# Patient Record
Sex: Female | Born: 1985 | Race: Black or African American | Hispanic: No | Marital: Single | Smoking: Current every day smoker
Health system: Southern US, Community
[De-identification: ages and names within clinical notes are randomized; demographics above are authoritative.]

## PROBLEM LIST (undated history)

## (undated) DIAGNOSIS — K219 Gastro-esophageal reflux disease without esophagitis: Secondary | ICD-10-CM

## (undated) HISTORY — DX: Gastro-esophageal reflux disease without esophagitis: K21.9

---

## 2021-10-29 ENCOUNTER — Emergency Department (HOSPITAL_COMMUNITY): Payer: 59

## 2021-10-29 ENCOUNTER — Emergency Department (HOSPITAL_COMMUNITY)
Admission: EM | Admit: 2021-10-29 | Discharge: 2021-10-29 | Disposition: A | Discharge status: Home / Self Care | Payer: 59 | Attending: Emergency Medicine | Admitting: Emergency Medicine

## 2021-10-29 ENCOUNTER — Encounter (HOSPITAL_COMMUNITY): Payer: Self-pay | Admitting: Emergency Medicine

## 2021-10-29 DIAGNOSIS — L299 Pruritus, unspecified: Secondary | ICD-10-CM | POA: Insufficient documentation

## 2021-10-29 DIAGNOSIS — R109 Unspecified abdominal pain: Secondary | ICD-10-CM | POA: Insufficient documentation

## 2021-10-29 DIAGNOSIS — Z20822 Contact with and (suspected) exposure to covid-19: Secondary | ICD-10-CM | POA: Diagnosis not present

## 2021-10-29 DIAGNOSIS — D509 Iron deficiency anemia, unspecified: Secondary | ICD-10-CM | POA: Insufficient documentation

## 2021-10-29 DIAGNOSIS — R051 Acute cough: Secondary | ICD-10-CM | POA: Insufficient documentation

## 2021-10-29 DIAGNOSIS — R8279 Other abnormal findings on microbiological examination of urine: Secondary | ICD-10-CM | POA: Insufficient documentation

## 2021-10-29 DIAGNOSIS — R21 Rash and other nonspecific skin eruption: Secondary | ICD-10-CM | POA: Diagnosis present

## 2021-10-29 LAB — COMPREHENSIVE METABOLIC PANEL
ALT: 16 U/L (ref 0–44)
AST: 19 U/L (ref 15–41)
Albumin: 4.1 g/dL (ref 3.5–5.0)
Alkaline Phosphatase: 62 U/L (ref 38–126)
Anion gap: 6 (ref 5–15)
BUN: 12 mg/dL (ref 6–20)
CO2: 25 mmol/L (ref 22–32)
Calcium: 8.9 mg/dL (ref 8.9–10.3)
Chloride: 108 mmol/L (ref 98–111)
Creatinine, Ser: 0.65 mg/dL (ref 0.44–1.00)
GFR, Estimated: >60 mL/min (ref >60)
Glucose, Bld: 95 mg/dL (ref 70–99)
Potassium: 3.5 mmol/L (ref 3.5–5.1)
Sodium: 139 mmol/L (ref 135–145)
Total Bilirubin: 0.4 mg/dL (ref 0.3–1.2)
Total Protein: 7.2 g/dL (ref 6.5–8.1)

## 2021-10-29 LAB — URINALYSIS, ROUTINE W REFLEX MICROSCOPIC
Bacteria, UA: NONE SEEN
Bilirubin Urine: NEGATIVE
Glucose, UA: NEGATIVE mg/dL
Hgb urine dipstick: NEGATIVE
Ketones, ur: NEGATIVE mg/dL
Leukocytes,Ua: NEGATIVE
Nitrite: NEGATIVE
Protein, ur: NEGATIVE mg/dL
Specific Gravity, Urine: 1.015 (ref 1.005–1.030)
pH: 7.5 (ref 5.0–8.0)

## 2021-10-29 LAB — CBC WITH DIFFERENTIAL/PLATELET
Abs Immature Granulocytes: 0.01 10*3/uL (ref 0.00–0.07)
Basophils Absolute: 0 10*3/uL (ref 0.0–0.1)
Basophils Relative: 0 %
Eosinophils Absolute: 0.1 10*3/uL (ref 0.0–0.5)
Eosinophils Relative: 2 %
HCT: 29.8 % — ABNORMAL LOW (ref 36.0–46.0)
Hemoglobin: 8.1 g/dL — ABNORMAL LOW (ref 12.0–15.0)
Immature Granulocytes: 0 %
Lymphocytes Relative: 24 %
Lymphs Abs: 1.2 10*3/uL (ref 0.7–4.0)
MCH: 19.5 pg — ABNORMAL LOW (ref 26.0–34.0)
MCHC: 27.2 g/dL — ABNORMAL LOW (ref 30.0–36.0)
MCV: 71.8 fL — ABNORMAL LOW (ref 80.0–100.0)
Monocytes Absolute: 0.3 10*3/uL (ref 0.1–1.0)
Monocytes Relative: 6 %
Neutro Abs: 3.5 10*3/uL (ref 1.7–7.7)
Neutrophils Relative %: 68 %
Platelets: 325 10*3/uL (ref 150–400)
RBC: 4.15 MIL/uL (ref 3.87–5.11)
RDW: 18.2 % — ABNORMAL HIGH (ref 11.5–15.5)
WBC: 5.2 10*3/uL (ref 4.0–10.5)
nRBC: 0 % (ref 0.0–0.2)

## 2021-10-29 LAB — RESP PANEL BY RT-PCR (FLU A&B, COVID) ARPGX2
Influenza A by PCR: NEGATIVE
Influenza B by PCR: NEGATIVE
SARS Coronavirus 2 by RT PCR: NEGATIVE

## 2021-10-29 LAB — LIPASE, BLOOD: Lipase: 41 U/L (ref 11–51)

## 2021-10-29 MED ORDER — BENZONATATE 100 MG PO CAPS: 100.0000 mg | ORAL_CAPSULE | Freq: Three times a day (TID) | ORAL | 0 refills | Status: DC | PRN

## 2021-10-29 MED ORDER — POLYETHYLENE GLYCOL 3350 17 G PO PACK: 17.0000 g | PACK | Freq: Every day | ORAL | 0 refills | Status: AC

## 2021-10-29 MED ORDER — FERROUS SULFATE 325 (65 FE) MG PO TABS: 325.0000 mg | ORAL_TABLET | Freq: Every day | ORAL | 0 refills | Status: DC

## 2021-10-29 MED ORDER — HYDROXYZINE HCL 25 MG PO TABS: 25.0000 mg | ORAL_TABLET | Freq: Three times a day (TID) | ORAL | 0 refills | Status: DC | PRN

## 2021-10-29 MED ORDER — HYDROXYZINE HCL 25 MG PO TABS
25.0000 mg | ORAL_TABLET | Freq: Once | ORAL | Status: AC
  Administered 2021-10-29: 21:00:00 25 mg via ORAL
  Filled 2021-10-29: qty 1

## 2021-10-29 MED ORDER — BENZONATATE 100 MG PO CAPS
100.0000 mg | ORAL_CAPSULE | Freq: Once | ORAL | Status: AC
  Administered 2021-10-29: 21:00:00 100 mg via ORAL
  Filled 2021-10-29: qty 1

## 2021-10-29 NOTE — Discharge Instructions (Addendum)
You came to the emergency department today to be evaluated for your itching, cough, and flank pain.  Your physical exam was reassuring.  Your lab work showed that you had anemia.  Due to this I have started you on iron pills.  Please take them as prescribed.  Please follow-up next week with your primary care provider or urgent care to have your CBC rechecked.  Your itching improved after receiving Atarax medication.  I have given you a prescription of this medication.  Please read attached paperwork for more information on this medication.  Your chest x-ray showed no signs of pneumonia.  Your cough is likely due to a viral upper respiratory infection versus bronchitis.  I have given you a prescription for Tessalon to help with your cough.  Take 1 pill every 8 hours as needed.  You have a COVID and influenza test pending at this time.  You can log onto [facility] MyChart to find your results.  If positive for COVID-19 or influenza please self isolate at home for the next 5 days.  Get help right away if you: Faint. If this happens, do not drive yourself to the hospital. Have chest pain. Have shortness of breath that: Is severe. Gets worse with physical activity. Have an irregular or rapid heartbeat. Become light-headed when getting up from a sitting or lying down position.

## 2021-10-29 NOTE — ED Triage Notes (Signed)
Patient with multiple complaints-states itching all over, B/L flank pain and bronchitis-symptoms for a couple of weeks-progressively getting worse-states she needs an inhaler

## 2021-10-29 NOTE — ED Provider Notes (Signed)
Emergency Medicine Provider Triage Evaluation Note  Marissa Jordan , a 35 y.o. female  was evaluated in triage.  Pt complains of bilateral flank pain and generalized itching.  States that she has been having bilateral flank pain intermittently since January 2022.  Describes her pain as sharp.  Pain has been worse over the last week.  Pain is worse to the left flank.  Patient endorses urinary frequency and urgency.  Patient also reports that over the last week she has had generalized itching all over her body.  Denies any rash.   LMP last month.  Denies any sexual intercourse with men. Review of Systems  Positive: Pruritus, flank pain, urinary urgency and frequency Negative: Fever, chills, dysuria, hematuria, abdominal pain, nausea, vomiting vaginal pain, vaginal bleeding, vaginal discharge  Physical Exam  BP (!) 141/80 (BP Location: Left Arm)    Pulse 96    Temp 98.4 F (36.9 C) (Oral)    Resp 16    SpO2 96%  Gen:   Awake, no distress   Resp:  Normal effort  MSK:   Moves extremities without difficulty  Other:  Abdomen soft, nondistended, nontender.  No guarding or rebound tenderness.  No CVA tenderness bilaterally.  Medical Decision Making  Medically screening exam initiated at 3:37 PM.  Appropriate orders placed.  Marissa Jordan was informed that the remainder of the evaluation will be completed by another provider, this initial triage assessment does not replace that evaluation, and the importance of remaining in the ED until their evaluation is complete.     Loni Beckwith, PA-C 10/29/21 [place], Ankit, MD 10/30/21 1534

## 2021-10-29 NOTE — ED Notes (Signed)
Pt eating [employer].

## 2021-10-29 NOTE — ED Provider Notes (Signed)
[facility] DEPT Provider Note   CSN: [id number] Arrival date & time: 10/29/21  1445     History Chief Complaint  Patient presents with   Flank Pain    Marissa Jordan is a 35 y.o. female presents to the emergency department with a chief complaint of bilateral flank pain, generalized pruritus, and nonproductive cough.  Patient reports that her bilateral flank pain has been intermittent x1 year.  States that pain is worse over the last week.  Patient states that the pain is worse to her left flank.  Patient reports the pain has been constant over the last week.  Denies any aggravating or alleviating factors.  Patient states that she has had urinary frequency over the last week as well.  Denies any dysuria, hematuria, abdominal pain, nausea, vomiting, diarrhea, constipation.  Denies any recent traumatic injuries or heavy lifting.  Patient reports that her general pruritus has been present over the last week.  Patient denies any exposure to new medications, detergents, personal hygiene products.  States that itching is located all over her body.  Patient has tried Benadryl with no relief of symptoms.  Denies any rash, facial swelling, trouble swallowing, trouble breathing.  Patient reports that nonproductive cough has been present over the last 2 days.  Cough has been constant over this time.  Patient has not tried any modalities to alleviate her symptoms.  Denies any associated rhinorrhea, nasal congestion, sore throat, fever, chills.  Patient reports that last menstrual period was at the beginning of last month.  States that there is no way she can be pregnant as she only has sex with female partners.   Flank Pain Pertinent negatives include no chest pain, no abdominal pain, no headaches and no shortness of breath.      History reviewed. No pertinent past medical history.  There are no problems to display for this patient.   History reviewed. No  pertinent surgical history.   OB History   No obstetric history on file.     No family history on file.     Home Medications Prior to Admission medications   Not on File    Allergies    Patient has no allergy information on record.  Review of Systems   Review of Systems  Constitutional:  Negative for chills and fever.  HENT:  Negative for congestion, rhinorrhea and sore throat.   Eyes:  Negative for visual disturbance.  Respiratory:  Positive for cough. Negative for shortness of breath.   Cardiovascular:  Negative for chest pain.  Gastrointestinal:  Negative for abdominal pain, nausea and vomiting.  Genitourinary:  Positive for flank pain. Negative for decreased urine volume, difficulty urinating, dysuria, genital sores, hematuria, pelvic pain, urgency, vaginal bleeding, vaginal discharge and vaginal pain.  Musculoskeletal:  Negative for back pain and neck pain.  Skin:  Negative for color change and rash.  Neurological:  Negative for dizziness, syncope, light-headedness and headaches.  Psychiatric/Behavioral:  Negative for confusion.    Physical Exam Updated Vital Signs BP (!) 145/84 (BP Location: Left Arm)    Pulse 95    Temp 99 F (37.2 C) (Oral)    Resp 16    Ht 5\' 7"  (1.702 m)    Wt 65.8 kg    SpO2 100%    BMI 22.71 kg/m   Physical Exam Vitals and nursing note reviewed.  Constitutional:      General: She is not in acute distress.    Appearance: She is not ill-appearing,  toxic-appearing or diaphoretic.  HENT:     Head: Normocephalic. No right periorbital erythema or left periorbital erythema.     Jaw: No trismus, swelling or pain on movement.     Mouth/Throat:     Lips: Pink. No lesions.     Mouth: Mucous membranes are moist. No oral lesions or angioedema.     Pharynx: Oropharynx is clear. Uvula midline. No pharyngeal swelling, oropharyngeal exudate, posterior oropharyngeal erythema or uvula swelling.  Eyes:     General: No scleral icterus.       Right eye: No  discharge.        Left eye: No discharge.  Cardiovascular:     Rate and Rhythm: Normal rate.  Pulmonary:     Effort: Pulmonary effort is normal. No tachypnea, bradypnea or respiratory distress.     Breath sounds: Normal breath sounds. No stridor.     Comments: Lungs clear to auscultation bilaterally.  Speaks in full complete sentences without difficulty.  Nonproductive cough observed Abdominal:     General: Abdomen is flat. There is no distension. There are no signs of injury.     Palpations: Abdomen is soft. There is no mass or pulsatile mass.     Tenderness: There is no abdominal tenderness. There is no right CVA tenderness, left CVA tenderness, guarding or rebound.     Hernia: There is no hernia in the umbilical area or ventral area.  Skin:    General: Skin is warm and dry.     Findings: No rash. Rash is not crusting, macular, nodular, papular, purpuric, pustular, scaling, urticarial or vesicular.     Comments: No rashes located to patient's bilateral upper extremities, abdomen, upper chest, back.  Neurological:     General: No focal deficit present.     Mental Status: She is alert.  Psychiatric:        Behavior: Behavior is cooperative.    ED Results / Procedures / Treatments   Labs (all labs ordered are listed, but only abnormal results are displayed) Labs Reviewed  CBC WITH DIFFERENTIAL/PLATELET - Abnormal; Notable for the following components:      Result Value   Hemoglobin 8.1 (*)    HCT 29.8 (*)    MCV 71.8 (*)    MCH 19.5 (*)    MCHC 27.2 (*)    RDW 18.2 (*)    All other components within normal limits  RESP PANEL BY RT-PCR (FLU A&B, COVID) ARPGX2  URINE CULTURE  COMPREHENSIVE METABOLIC PANEL  LIPASE, BLOOD  URINALYSIS, ROUTINE W REFLEX MICROSCOPIC    EKG None  Radiology No results found.  Procedures Procedures   Medications Ordered in ED Medications  benzonatate (TESSALON) capsule 100 mg (has no administration in time range)  hydrOXYzine (ATARAX)  tablet 25 mg (has no administration in time range)    ED Course  I have reviewed the triage vital signs and the nursing notes.  Pertinent labs & imaging results that were available during my care of the patient were reviewed by me and considered in my medical decision making (see chart for details).    MDM Rules/Calculators/A&P                           Alert 35 year old female no acute stress, nontoxic-appearing.  Presents emergency department with a complaint of bilateral flank pain, generalized pruritus, nonproductive cough.  Patient states that bilateral flank pain has been intermittent x1 year.  Pain has been constant over  the last week.  Endorses associated urinary frequency.  On exam abdomen soft, nondistended, nontender.  No CVA tenderness.  Urinalysis pending at this time.  Patient reports general pruritus x1 week.  Denies any rash.  No exposure to new soaps, detergents, personal cleaning products, personal hygiene products.  Exam no rash is seen.  We will give patient Atarax for her itching and reassess.  Patient reports nonproductive cough x2 days.  No other upper respiratory infection symptoms.  Lungs clear to auscultation bilaterally.  Will obtain chest x-ray and COVID-19/influenza testing.  Patient given Tessalon to help with her cough.  Patient reports resolution of itching after receiving Atarax.  Patient reports improvement in her cough after receiving Tessalon.  Chest x-ray shows no active cardiopulmonary disease.  Suspect that patient's cough is due to viral URI versus bronchitis.  Urinalysis is unremarkable.  Low suspicion for urinary tract infection at this time.  Plan to discharge patient at this time.  We will give patient prescription for iron due to iron deficiency anemia.  Patient advised to follow-up with primary care provider or urgent care next week for repeat CBC testing.  Will prescribe patient with short course of hydroxyzine for her pruritus.  Prescribe  patient a short course of Tessalon for her cough.  Discussed results, findings, treatment and follow up. Patient advised of return precautions. Patient verbalized understanding and agreed with plan.      Final Clinical Impression(s) / ED Diagnoses Final diagnoses:  Pruritus  Iron deficiency anemia, unspecified iron deficiency anemia type  Acute cough    Rx / DC Orders ED Discharge Orders          Ordered    ferrous sulfate 325 (65 FE) MG tablet  Daily        10/29/21 2115    polyethylene glycol (MIRALAX) 17 g packet  Daily        10/29/21 2115    benzonatate (TESSALON) 100 MG capsule  Every 8 hours PRN        10/29/21 2115    hydrOXYzine (ATARAX) 25 MG tablet  Every 8 hours PRN        10/29/21 2115             Dyann Ruddle 10/29/21 2351    Daleen Bo, MD 11/01/21 [phone]

## 2021-10-31 LAB — URINE CULTURE: Culture: 40000 — AB

## 2021-11-01 ENCOUNTER — Telehealth: Payer: Self-pay | Admitting: Emergency Medicine

## 2021-11-01 NOTE — Telephone Encounter (Signed)
Post ED Visit - Positive Culture Follow-up  Culture report reviewed by antimicrobial stewardship pharmacist: [facility] Team []  Elenor Quinones, Pharm.D. []  Heide Guile, Pharm.D., BCPS AQ-ID []  Parks Neptune, Pharm.D., BCPS []  Alycia Rossetti, Pharm.D., BCPS []  [place], Pharm.D., BCPS, AAHIVP []  Legrand Como, Pharm.D., BCPS, AAHIVP []  Salome Arnt, PharmD, BCPS []  Johnnette Gourd, PharmD, BCPS []  Hughes Better, PharmD, BCPS []  Leeroy Cha, PharmD []  Laqueta Linden, PharmD, BCPS []  Albertina Parr, PharmD  [facility] Team []  Leodis Sias, PharmD []  Lindell Spar, PharmD []  Royetta Asal, PharmD []  Graylin Shiver, Rph []  Rema Fendt) Glennon Mac, PharmD []  Arlyn Dunning, PharmD []  Netta Cedars, PharmD []  Dia Sitter, PharmD []  Leone Haven, PharmD []  Gretta Arab, PharmD []  Theodis Shove, PharmD []  Peggyann Juba, PharmD [x]  Reuel Boom, PharmD   Positive urine culture Treated with none, likely contaminant, no further patient follow-up is required at this time.  Hazle Nordmann 11/01/2021, 11:41 AM

## 2022-01-31 ENCOUNTER — Other Ambulatory Visit: Payer: Self-pay | Admitting: *Deleted

## 2022-01-31 DIAGNOSIS — R1900 Intra-abdominal and pelvic swelling, mass and lump, unspecified site: Secondary | ICD-10-CM

## 2022-02-05 ENCOUNTER — Emergency Department (HOSPITAL_COMMUNITY): Payer: 59

## 2022-02-05 ENCOUNTER — Emergency Department (HOSPITAL_COMMUNITY)
Admission: EM | Admit: 2022-02-05 | Discharge: 2022-02-05 | Disposition: A | Discharge status: Home / Self Care | Payer: 59 | Attending: Emergency Medicine | Admitting: Emergency Medicine

## 2022-02-05 ENCOUNTER — Other Ambulatory Visit: Payer: Self-pay

## 2022-02-05 DIAGNOSIS — R42 Dizziness and giddiness: Secondary | ICD-10-CM | POA: Insufficient documentation

## 2022-02-05 DIAGNOSIS — D219 Benign neoplasm of connective and other soft tissue, unspecified: Secondary | ICD-10-CM

## 2022-02-05 DIAGNOSIS — D649 Anemia, unspecified: Secondary | ICD-10-CM | POA: Insufficient documentation

## 2022-02-05 DIAGNOSIS — N9489 Other specified conditions associated with female genital organs and menstrual cycle: Secondary | ICD-10-CM | POA: Insufficient documentation

## 2022-02-05 DIAGNOSIS — D259 Leiomyoma of uterus, unspecified: Secondary | ICD-10-CM | POA: Insufficient documentation

## 2022-02-05 DIAGNOSIS — N83202 Unspecified ovarian cyst, left side: Secondary | ICD-10-CM | POA: Insufficient documentation

## 2022-02-05 DIAGNOSIS — R1032 Left lower quadrant pain: Secondary | ICD-10-CM | POA: Diagnosis present

## 2022-02-05 LAB — CBC WITH DIFFERENTIAL/PLATELET
Abs Immature Granulocytes: 0.01 10*3/uL (ref 0.00–0.07)
Basophils Absolute: 0 10*3/uL (ref 0.0–0.1)
Basophils Relative: 0 %
Eosinophils Absolute: 0.1 10*3/uL (ref 0.0–0.5)
Eosinophils Relative: 2 %
HCT: 34.9 % — ABNORMAL LOW (ref 36.0–46.0)
Hemoglobin: 10.4 g/dL — ABNORMAL LOW (ref 12.0–15.0)
Immature Granulocytes: 0 %
Lymphocytes Relative: 27 %
Lymphs Abs: 1.2 10*3/uL (ref 0.7–4.0)
MCH: 23.9 pg — ABNORMAL LOW (ref 26.0–34.0)
MCHC: 29.8 g/dL — ABNORMAL LOW (ref 30.0–36.0)
MCV: 80 fL (ref 80.0–100.0)
Monocytes Absolute: 0.2 10*3/uL (ref 0.1–1.0)
Monocytes Relative: 5 %
Neutro Abs: 2.9 10*3/uL (ref 1.7–7.7)
Neutrophils Relative %: 66 %
Platelets: 305 10*3/uL (ref 150–400)
RBC: 4.36 MIL/uL (ref 3.87–5.11)
RDW: 18.4 % — ABNORMAL HIGH (ref 11.5–15.5)
WBC: 4.4 10*3/uL (ref 4.0–10.5)
nRBC: 0 % (ref 0.0–0.2)

## 2022-02-05 LAB — COMPREHENSIVE METABOLIC PANEL
ALT: 17 U/L (ref 0–44)
AST: 20 U/L (ref 15–41)
Albumin: 4.2 g/dL (ref 3.5–5.0)
Alkaline Phosphatase: 63 U/L (ref 38–126)
Anion gap: 8 (ref 5–15)
BUN: 8 mg/dL (ref 6–20)
CO2: 21 mmol/L — ABNORMAL LOW (ref 22–32)
Calcium: 8.9 mg/dL (ref 8.9–10.3)
Chloride: 108 mmol/L (ref 98–111)
Creatinine, Ser: 0.68 mg/dL (ref 0.44–1.00)
GFR, Estimated: >60 mL/min (ref >60)
Glucose, Bld: 97 mg/dL (ref 70–99)
Potassium: 3.7 mmol/L (ref 3.5–5.1)
Sodium: 137 mmol/L (ref 135–145)
Total Bilirubin: 0.4 mg/dL (ref 0.3–1.2)
Total Protein: 7.3 g/dL (ref 6.5–8.1)

## 2022-02-05 LAB — URINALYSIS, ROUTINE W REFLEX MICROSCOPIC
Bilirubin Urine: NEGATIVE
Glucose, UA: NEGATIVE mg/dL
Hgb urine dipstick: NEGATIVE
Ketones, ur: NEGATIVE mg/dL
Nitrite: NEGATIVE
Protein, ur: NEGATIVE mg/dL
Specific Gravity, Urine: 1.008 (ref 1.005–1.030)
pH: 5 (ref 5.0–8.0)

## 2022-02-05 LAB — I-STAT BETA HCG BLOOD, ED (MC, WL, AP ONLY): I-stat hCG, quantitative: <5 m[IU]/mL (ref <5)

## 2022-02-05 MED ORDER — MECLIZINE HCL 12.5 MG PO TABS: 12.5000 mg | ORAL_TABLET | Freq: Three times a day (TID) | ORAL | 0 refills | Status: DC | PRN

## 2022-02-05 MED ORDER — HYDROCODONE-ACETAMINOPHEN 5-325 MG PO TABS: 1.0000 | ORAL_TABLET | ORAL | 0 refills | Status: DC | PRN

## 2022-02-05 NOTE — ED Triage Notes (Signed)
Patient reports L inguinal pain, had went to the UC yesterday and they told her to come to the ED for an [place]. Denies abnormal discharge, reports vaginal dryness. Reports dizziness.  ?

## 2022-02-05 NOTE — ED Notes (Signed)
Ultrasound at bedside

## 2022-02-05 NOTE — Discharge Instructions (Signed)
Return to the ED with any new symptoms like we discussed ?Please follow-up with the PCP I referred you to. ?Please also follow-up with the women's health center I referred her to.  The number is attached to this discharge packet ?Please pick up your medicine from the pharmacy that you requested ?Please read the attached informational guide concerning ovarian cyst and uterine fibroids ?

## 2022-02-05 NOTE — ED Provider Notes (Signed)
?[facility] DEPT ?Provider Note ? ? ?CSN: [id number] ?Arrival date & time: 02/05/22  9983 ? ?  ? ?History ? ?Chief Complaint  ?Patient presents with  ? Groin Pain  ? ? ?Marissa Jordan is a 36 y.o. female with no medical history provided.  Patient reports to ED for evaluation of left-sided groin pain.  Patient states that for the "last couple of months" she had left-sided groin pain that is acutely worsened in the last few days.  Patient states that she has not been seen for this issue by medical provider, she recently moved here from [place].  Patient states that she is having painful intercourse, climax.  Patient went to walk-in clinic recently was told to come to ER for evaluation, ultrasound.  Patient also complaining of dizziness, history of anemia, patient states that she feels dizzy and has been unable to get her balance.  Patient states that she has been seen for the same complaint before and diagnosed with "dizziness, not vertigo.  She said it was just a symptom".  Patient provided with meclizine at past provider.  Patient endorsing dizziness, left-sided groin pain, vaginal dryness, painful intercourse.  Patient denies any fevers, nausea, vomiting, diarrhea, vaginal discharge, vaginal pain, vaginal itchiness. ? ? ?Groin Pain ? ? ?  ? ?Home Medications ?Prior to Admission medications   ?Medication Sig Start Date End Date Taking? Authorizing Provider  ?HYDROcodone-acetaminophen (NORCO/VICODIN) 5-325 MG tablet Take 1 tablet by mouth every 4 (four) hours as needed for moderate pain. 02/05/22  Yes Azucena Cecil, PA-C  ?meclizine (ANTIVERT) 12.5 MG tablet Take 1 tablet (12.5 mg total) by mouth 3 (three) times daily as needed for dizziness. 02/05/22  Yes Azucena Cecil, PA-C  ?benzonatate (TESSALON) 100 MG capsule Take 1 capsule (100 mg total) by mouth every 8 (eight) hours as needed for cough. 10/29/21   Loni Beckwith, PA-C  ?ferrous sulfate 325 (65 FE) MG tablet Take 1  tablet (325 mg total) by mouth daily. 10/29/21   Loni Beckwith, PA-C  ?hydrOXYzine (ATARAX) 25 MG tablet Take 1 tablet (25 mg total) by mouth every 8 (eight) hours as needed for itching. 10/29/21   Loni Beckwith, PA-C  ?polyethylene glycol (MIRALAX) 17 g packet Take 17 g by mouth daily. 10/29/21   Loni Beckwith, PA-C  ?   ? ?Allergies    ?Patient has no known allergies.   ? ?Review of Systems   ?Review of Systems  ?Constitutional:  Negative for chills and fever.  ?Gastrointestinal:  Negative for diarrhea, nausea and vomiting.  ?Genitourinary:  Positive for dyspareunia. Negative for dysuria, pelvic pain, vaginal discharge and vaginal pain.  ?Neurological:  Positive for dizziness.  ?All other systems reviewed and are negative. ? ?Physical Exam ?Updated Vital Signs ?BP 126/84 (BP Location: Right Arm)   Pulse 87   Temp 98.4 ?F (36.9 ?C) (Oral)   Resp 18   LMP 01/05/2022 (Approximate)   SpO2 95%  ?Physical Exam ?Vitals and nursing note reviewed.  ?Constitutional:   ?   General: She is not in acute distress. ?   Appearance: Normal appearance. She is not ill-appearing, toxic-appearing or diaphoretic.  ?HENT:  ?   Head: Normocephalic and atraumatic.  ?   Nose: Nose normal. No congestion.  ?   Mouth/Throat:  ?   Mouth: Mucous membranes are moist.  ?   Pharynx: Oropharynx is clear.  ?Eyes:  ?   Extraocular Movements: Extraocular movements intact.  ?   Conjunctiva/sclera:  Conjunctivae normal.  ?   Pupils: Pupils are equal, round, and reactive to light.  ?Cardiovascular:  ?   Rate and Rhythm: Normal rate and regular rhythm.  ?Pulmonary:  ?   Effort: Pulmonary effort is normal.  ?   Breath sounds: Normal breath sounds. No wheezing.  ?Abdominal:  ?   General: Abdomen is flat. Bowel sounds are normal.  ?   Palpations: Abdomen is soft.  ?   Tenderness: There is abdominal tenderness in the left lower quadrant.  ?Musculoskeletal:  ?   Cervical back: Normal range of motion and neck supple. No rigidity.   ?Skin: ?   General: Skin is warm and dry.  ?   Capillary Refill: Capillary refill takes less than 2 seconds.  ?Neurological:  ?   Mental Status: She is alert and oriented to person, place, and time.  ? ? ?ED Results / Procedures / Treatments   ?Labs ?(all labs ordered are listed, but only abnormal results are displayed) ?Labs Reviewed  ?CBC WITH DIFFERENTIAL/PLATELET - Abnormal; Notable for the following components:  ?    Result Value  ? Hemoglobin 10.4 (*)   ? HCT 34.9 (*)   ? MCH 23.9 (*)   ? MCHC 29.8 (*)   ? RDW 18.4 (*)   ? All other components within normal limits  ?COMPREHENSIVE METABOLIC PANEL - Abnormal; Notable for the following components:  ? CO2 21 (*)   ? All other components within normal limits  ?URINALYSIS, ROUTINE W REFLEX MICROSCOPIC - Abnormal; Notable for the following components:  ? APPearance HAZY (*)   ? Leukocytes,Ua TRACE (*)   ? Bacteria, UA RARE (*)   ? All other components within normal limits  ?I-STAT BETA HCG BLOOD, ED ([facility], [facility], [facility] ONLY)  ? ? ?EKG ?None ? ?Radiology ?US Transvaginal Non-OB ? ?Result Date: 02/05/2022 ?CLINICAL DATA:  Left-sided pelvic pain. EXAM: TRANSABDOMINAL AND TRANSVAGINAL ULTRASOUND OF PELVIS DOPPLER ULTRASOUND OF OVARIES TECHNIQUE: Both transabdominal and transvaginal ultrasound examinations of the pelvis were performed. Transabdominal technique was performed for global imaging of the pelvis including uterus, ovaries, adnexal regions, and pelvic cul-de-sac. It was necessary to proceed with endovaginal exam following the transabdominal exam to visualize the ovaries. Color and duplex Doppler ultrasound was utilized to evaluate blood flow to the ovaries. COMPARISON:  None. FINDINGS: Uterus Measurements: 8.8 x 5.7 x 6.1 cm = volume: 161 mL. Two separate ill-defined fundal hypoechoic masses likely represent fibroids and measure approximately 2.5 cm and 2.2 cm in respective maximal diameter. Endometrium Thickness: 11 mm.  No focal abnormality visualized. Right ovary  Measurements: 3.0 x 1.5 x 2.4 cm = volume: 5.4 mL. Normal appearance/no adnexal mass. Left ovary Measurements: 3.1 x 2.3 x 2.6 cm = volume: 9.3 mL. Complex cystic area measures approximately 2.0 x 1.7 x 1.7 cm by transvaginal exam and likely represents a hemorrhagic cyst. Pulsed Doppler evaluation of both ovaries demonstrates normal low-resistance arterial and venous waveforms. Other findings No abnormal free fluid. IMPRESSION: 1. Small uterine fibroids. 2. 2 cm complex cyst of the left ovary likely represents a hemorrhagic cyst. No associated free fluid. No followup imaging recommended. Note: This recommendation does not apply to premenarchal patients or to those with increased risk (genetic, family history, elevated tumor markers or other high-risk factors) of ovarian cancer. Reference: Radiology 2019 Nov; 293(2):359-371. Electronically Signed   By: Aletta Edouard M.D.   On: 02/05/2022 12:07  ? ?US Pelvis Complete ? ?Result Date: 02/05/2022 ?CLINICAL DATA:  Left-sided pelvic pain. EXAM:  TRANSABDOMINAL AND TRANSVAGINAL ULTRASOUND OF PELVIS DOPPLER ULTRASOUND OF OVARIES TECHNIQUE: Both transabdominal and transvaginal ultrasound examinations of the pelvis were performed. Transabdominal technique was performed for global imaging of the pelvis including uterus, ovaries, adnexal regions, and pelvic cul-de-sac. It was necessary to proceed with endovaginal exam following the transabdominal exam to visualize the ovaries. Color and duplex Doppler ultrasound was utilized to evaluate blood flow to the ovaries. COMPARISON:  None. FINDINGS: Uterus Measurements: 8.8 x 5.7 x 6.1 cm = volume: 161 mL. Two separate ill-defined fundal hypoechoic masses likely represent fibroids and measure approximately 2.5 cm and 2.2 cm in respective maximal diameter. Endometrium Thickness: 11 mm.  No focal abnormality visualized. Right ovary Measurements: 3.0 x 1.5 x 2.4 cm = volume: 5.4 mL. Normal appearance/no adnexal mass. Left ovary  Measurements: 3.1 x 2.3 x 2.6 cm = volume: 9.3 mL. Complex cystic area measures approximately 2.0 x 1.7 x 1.7 cm by transvaginal exam and likely represents a hemorrhagic cyst. Pulsed Doppler evaluation of both ovaries demonstrates

## 2022-02-05 NOTE — ED Provider Triage Note (Signed)
Emergency Medicine Provider Triage Evaluation Note ? ?Marissa Jordan , a 36 y.o. female  was evaluated in triage.  Pt complains of left groin pain, worse with intercourse/climax, radiates to rectum, lasts 5 minutes. Went to a walk in clinic and was told to come to the ER.  ?Diagnosed with adenomyosis, reports medication caused her to bleed. Hx anemia, hgb 9, states feels dizzy, unsure if due to anemia.  ?Symptoms started about a month ago, pain was worse today in the groin and prompted her to come to the ER.  ?Review of Systems  ?Positive: Groin pain ?Negative: Fevers, abnormal dc ? ?Physical Exam  ?BP 126/84 (BP Location: Right Arm)   Pulse 87   Temp 98.4 ?F (36.9 ?C) (Oral)   Resp 18   LMP 01/05/2022 (Approximate)   SpO2 95%  ?Gen:   Awake, no distress   ?Resp:  Normal effort  ?MSK:   Moves extremities without difficulty  ?Other:  TTP just left of midline suprapubic area, no appreciable mass or skin changes  ? ?Medical Decision Making  ?Medically screening exam initiated at 9:51 AM.  Appropriate orders placed.  Marissa Jordan was informed that the remainder of the evaluation will be completed by another provider, this initial triage assessment does not replace that evaluation, and the importance of remaining in the ED until their evaluation is complete. ? ? ?  ?Tacy Learn, PA-C ?02/05/22 0957 ? ?

## 2022-03-10 ENCOUNTER — Ambulatory Visit: Payer: 59 | Admitting: Family Medicine

## 2022-07-04 ENCOUNTER — Telehealth: Payer: Self-pay | Admitting: Oncology

## 2022-07-04 NOTE — Telephone Encounter (Signed)
Scheduled appt per 8/28 referral. Pt is aware of appt date and time. Pt is aware to arrive 15 mins prior to appt time and to bring and updated insurance card. Pt is aware of appt location.   

## 2022-07-26 ENCOUNTER — Inpatient Hospital Stay: Payer: 59 | Attending: Oncology | Admitting: Oncology

## 2022-07-26 ENCOUNTER — Telehealth: Payer: Self-pay | Admitting: *Deleted

## 2022-07-26 NOTE — Telephone Encounter (Signed)
PC to patient regarding her missed appointment this a.m., she forgot about it.  Informed patient our scheduling department will contact her to reschedule, she verbalizes understanding.  Scheduling message sent.

## 2022-07-27 ENCOUNTER — Telehealth: Payer: Self-pay | Admitting: Oncology

## 2022-07-27 NOTE — Telephone Encounter (Signed)
Called pt to r/s missed appt. No answer. Left msg for pt to call back to r/s appt.

## 2023-01-13 ENCOUNTER — Telehealth: Payer: Self-pay | Admitting: Internal Medicine

## 2023-01-13 NOTE — Telephone Encounter (Signed)
scheduled per 3/6 referral , pt has been called and confirmed date and time. Pt is aware of location and to arrive early for check in   

## 2023-01-23 ENCOUNTER — Other Ambulatory Visit: Payer: Self-pay

## 2023-01-23 DIAGNOSIS — D649 Anemia, unspecified: Secondary | ICD-10-CM

## 2023-01-24 ENCOUNTER — Inpatient Hospital Stay: Payer: Medicaid Other

## 2023-01-24 ENCOUNTER — Inpatient Hospital Stay: Payer: Medicaid Other | Attending: Internal Medicine | Admitting: Internal Medicine

## 2023-02-20 NOTE — Progress Notes (Unsigned)
[facility] [phone]   [phone]  INITIAL CONSULT NOTE  Patient Care Team: Pcp, No as PCP - General  Hematological/Oncological History Labs from [facility] 06/30/2022: WBC 5.5, Hgb 7.7 (L), MCV 67.3 (L), Plt 324, Iron 13 (L), TIBC 508 (H), Iron saturation 3% (L), Ferritin 2.7 (L).  01/12/2023: WBC 4.7, Hgb 8.5 (L), MCV 70.4 (L), Plt 319 02/21/2023: Establish care with [facility]  CHIEF COMPLAINTS/PURPOSE OF CONSULTATION:  Iron deficiency anemia 2/2 menorrhagia   HISTORY OF PRESENTING ILLNESS:  Marissa Jordan 37 y.o. female with medical history significant for GERD presents to the hematology clinic for evaluation for iron deficiency anemia.   On exam today, Marissa Jordan reports that she has been iron deficient for several months.  She takes iron pills every few days due to baseline constipation that has worsened.  She has heavy menstrual cycles lasting 7 days with 3 days of heavy bleeding.  She requires using an ultra tampon and menstrual underwear and changing it every hour on the days of heavy bleeding.  She is planning to try Lysteda to help with her menstrual bleeding.  She is not interested in hormone contraceptive therapy.  Marissa Jordan reports she is fatigued which is chronic in nature but getting worse over several months.  She reports her fatigue interferes with her work and she required frequent resting.  She reports occasional episodes of dizziness without any syncopal episodes.  She does have nausea with vomiting but contributes this to her constipation.  She has a bowel movement once a week with the use of laxatives.  She adds that she is planning to undergo EGD and colonoscopy for further evaluation.  Patient does crave ice.  Patient reports increased urinary frequency without dysuria or hematuria.  Patient denies fevers, chills, night sweats, shortness of breath, chest pain or cough.  She has no other complaints.  MEDICAL HISTORY:   Past Medical History:  Diagnosis Date   GERD (gastroesophageal reflux disease)     SURGICAL HISTORY: History reviewed. No pertinent surgical history.  SOCIAL HISTORY: Social History   Socioeconomic History   Marital status: Single    Spouse name: Not on file   Number of children: Not on file   Years of education: Not on file   Highest education level: Not on file  Occupational History   Not on file  Tobacco Use   Smoking status: Every Day    Years: 10    Types: Cigarettes   Smokeless tobacco: Never   Tobacco comments:    Smoke 2-3 cigarettes a day  Substance and Sexual Activity   Alcohol use: Yes    Alcohol/week: 2.0 standard drinks of alcohol    Types: 2 Shots of liquor per week    Comment: socially   Drug use: Never   Sexual activity: Not on file  Other Topics Concern   Not on file  Social History Narrative   Not on file   Social Determinants of Health   Financial Resource Strain: Not on file  Food Insecurity: Food Insecurity Present (02/21/2023)   Hunger Vital Sign    Worried About Running Out of Food in the Last Year: Never true    Ran Out of Food in the Last Year: Sometimes true  Transportation Needs: No Transportation Needs (02/21/2023)   PRAPARE - Administrator, Civil Service (Medical): No    Lack of Transportation (Non-Medical): No  Physical Activity: Not on file  Stress: Not on file  Social Connections: Not on file  Intimate Partner Violence: Not At Risk (02/21/2023)   Humiliation, Afraid, Rape, and Kick questionnaire    Fear of Current or Ex-Partner: No    Emotionally Abused: No    Physically Abused: No    Sexually Abused: No    FAMILY HISTORY: Family History  Problem Relation Age of Onset   Cancer Maternal Grandmother    Cancer Maternal Grandfather     ALLERGIES:  has No Known Allergies.  MEDICATIONS:  Current Outpatient Medications  Medication Sig Dispense Refill   pantoprazole (PROTONIX) 40 MG tablet Take by mouth.      polyethylene glycol (MIRALAX) 17 g packet Take 17 g by mouth daily. 14 each 0   benzonatate (TESSALON) 100 MG capsule Take 1 capsule (100 mg total) by mouth every 8 (eight) hours as needed for cough. (Patient not taking: Reported on 02/21/2023) 21 capsule 0   ferrous sulfate 325 (65 FE) MG tablet Take 1 tablet (325 mg total) by mouth daily. (Patient not taking: Reported on 02/21/2023) 30 tablet 0   HYDROcodone-acetaminophen (NORCO/VICODIN) 5-325 MG tablet Take 1 tablet by mouth every 4 (four) hours as needed for moderate pain. (Patient not taking: Reported on 02/21/2023) 5 tablet 0   hydrOXYzine (ATARAX) 25 MG tablet Take 1 tablet (25 mg total) by mouth every 8 (eight) hours as needed for itching. (Patient not taking: Reported on 02/21/2023) 12 tablet 0   meclizine (ANTIVERT) 12.5 MG tablet Take 1 tablet (12.5 mg total) by mouth 3 (three) times daily as needed for dizziness. (Patient not taking: Reported on 02/21/2023) 30 tablet 0   No current facility-administered medications for this visit.    REVIEW OF SYSTEMS:   Constitutional: ( - ) fevers, ( - )  chills , ( - ) night sweats Eyes: ( - ) blurriness of vision, ( - ) double vision, ( - ) watery eyes Ears, nose, mouth, throat, and face: ( - ) mucositis, ( - ) sore throat Respiratory: ( - ) cough, ( - ) dyspnea, ( - ) wheezes Cardiovascular: ( - ) palpitation, ( - ) chest discomfort, ( - ) lower extremity swelling Gastrointestinal:  ( + ) nausea, ( - ) heartburn, (+ ) change in bowel habits Skin: ( - ) abnormal skin rashes Lymphatics: ( - ) new lymphadenopathy, ( - ) easy bruising Neurological: ( - ) numbness, ( - ) tingling, ( - ) new weaknesses Behavioral/Psych: ( - ) mood change, ( - ) new changes  All other systems were reviewed with the patient and are negative.  PHYSICAL EXAMINATION: ECOG PERFORMANCE STATUS: 1 - Symptomatic but completely ambulatory  Vitals:   02/21/23 0912  BP: (!) 141/88  Pulse: 92  Resp: 17  Temp: (!) 97.3 F (36.3  C)  SpO2: 100%   Filed Weights   02/21/23 0912  Weight: 143 lb 1.6 oz (64.9 kg)    GENERAL: well appearing female in NAD  SKIN: skin color, texture, turgor are normal, no rashes or significant lesions EYES: conjunctiva are pink and non-injected, sclera clear OROPHARYNX: no exudate, no erythema; lips, buccal mucosa, and tongue normal  LUNGS: clear to auscultation and percussion with normal breathing effort HEART: regular rate & rhythm and no murmurs and no lower extremity edema Musculoskeletal: no cyanosis of digits and no clubbing  PSYCH: alert & oriented x 3, fluent speech NEURO: no focal motor/sensory deficits  LABORATORY DATA:  I have reviewed the data as listed    Latest Ref Rng &  Units 02/21/2023   10:16 AM 02/05/2022   10:49 AM 10/29/2021    4:11 PM  CBC  WBC 4.0 - 10.5 K/uL 4.0  4.4  5.2   Hemoglobin 12.0 - 15.0 g/dL 8.8  21.3  8.1   Hematocrit 36.0 - 46.0 % 29.8  34.9  29.8   Platelets 150 - 400 K/uL 311  305  325        Latest Ref Rng & Units 02/05/2022   10:49 AM 10/29/2021    4:11 PM  CMP  Glucose 70 - 99 mg/dL 97  95   BUN 6 - 20 mg/dL 8  12   Creatinine 0.86 - 1.00 mg/dL 5.78  4.69   Sodium 629 - 145 mmol/L 137  139   Potassium 3.5 - 5.1 mmol/L 3.7  3.5   Chloride 98 - 111 mmol/L 108  108   CO2 22 - 32 mmol/L 21  25   Calcium 8.9 - 10.3 mg/dL 8.9  8.9   Total Protein 6.5 - 8.1 g/dL 7.3  7.2   Total Bilirubin 0.3 - 1.2 mg/dL 0.4  0.4   Alkaline Phos 38 - 126 U/L 63  62   AST 15 - 41 U/L 20  19   ALT 0 - 44 U/L 17  16    ASSESSMENT & PLAN Denzil Bristol is a 37 y.o. female who presents to the hematology clinic for evaluation of iron deficiency anemia.   # Iron Deficiency Anemia 2/2 to GYN Bleeding --Findings are consistent with iron deficiency anemia secondary to patient's menorrhagia --Encouraged her to follow-up with OB/GYN for better control of her menstrual cycles --We will confirm iron deficiency anemia by ordering iron panel and ferritin as well  as reticulocytes, CBC, and CMP --Unable to tolerate PO iron due to constipation --We will plan to proceed with IV iron therapy in order to help bolster the patient's blood counts --Plan for return to clinic in 4 to 6 weeks time after last dose of IV iron  #Constipation: --Encouraged patient to be more aggressive with stool regimen including senakot plus miralax.     Orders Placed This Encounter  Procedures   CBC with Differential (Cancer Center Only)    Standing Status:   Future    Number of Occurrences:   1    Standing Expiration Date:   02/20/2024   CMP (Cancer Center only)    Standing Status:   Future    Number of Occurrences:   1    Standing Expiration Date:   02/20/2024   Ferritin    Standing Status:   Future    Number of Occurrences:   1    Standing Expiration Date:   02/20/2024   Sample to Blood Bank    Standing Status:   Future    Number of Occurrences:   1    Standing Expiration Date:   02/20/2024    All questions were answered. The patient knows to call the clinic with any problems, questions or concerns.  I have spent a total of 60 minutes minutes of face-to-face and non-face-to-face time, preparing to see the patient, obtaining and/or reviewing separately obtained history, performing a medically appropriate examination, counseling and educating the patient, ordering medications/tests/procedures, documenting clinical information in the electronic health record,and care coordination.   Georga Kaufmann, PA-C Department of Hematology/Oncology [facility] Phone: [phone]  Patient was seen with Dr. Leonides Schanz.   I have read the above note and personally  examined the patient. I agree with the assessment and plan as noted above.  Briefly Marissa Jordan is a 37 year old female who presents for evaluation of iron deficiency anemia.  This iron deficiency anemia is thought to be secondary to heavy menstrual cycles.  On her last check on  02/05/2022 patient had a hemoglobin of 10.4 with blood cell count 4.4 and platelets of 305.  The patient is unable to tolerate p.o. iron therapy.  Today we will order a full iron panel and if found to be deficient we will replete her with IV iron therapy.  Additionally recommend she continue to follow-up with OB/GYN for better control of her menstrual cycles.   Ulysees Barns, MD Department of Hematology/Oncology [facility] at [facility] Phone: [phone] Pager: [phone] Email: [email or web address]

## 2023-02-21 ENCOUNTER — Encounter: Payer: Self-pay | Admitting: Physician Assistant

## 2023-02-21 ENCOUNTER — Inpatient Hospital Stay: Payer: Medicaid Other

## 2023-02-21 ENCOUNTER — Telehealth: Payer: Self-pay | Admitting: Physician Assistant

## 2023-02-21 ENCOUNTER — Inpatient Hospital Stay: Payer: Medicaid Other | Attending: Internal Medicine | Admitting: Physician Assistant

## 2023-02-21 ENCOUNTER — Other Ambulatory Visit: Payer: Self-pay

## 2023-02-21 VITALS — BP 141/88 | HR 92 | Temp 97.3°F | Resp 17 | Wt 143.1 lb

## 2023-02-21 DIAGNOSIS — Z79899 Other long term (current) drug therapy: Secondary | ICD-10-CM

## 2023-02-21 DIAGNOSIS — K219 Gastro-esophageal reflux disease without esophagitis: Secondary | ICD-10-CM | POA: Diagnosis not present

## 2023-02-21 DIAGNOSIS — Z809 Family history of malignant neoplasm, unspecified: Secondary | ICD-10-CM | POA: Diagnosis not present

## 2023-02-21 DIAGNOSIS — K5900 Constipation, unspecified: Secondary | ICD-10-CM | POA: Diagnosis not present

## 2023-02-21 DIAGNOSIS — D500 Iron deficiency anemia secondary to blood loss (chronic): Secondary | ICD-10-CM | POA: Diagnosis not present

## 2023-02-21 DIAGNOSIS — R110 Nausea: Secondary | ICD-10-CM

## 2023-02-21 DIAGNOSIS — R112 Nausea with vomiting, unspecified: Secondary | ICD-10-CM | POA: Diagnosis not present

## 2023-02-21 DIAGNOSIS — R42 Dizziness and giddiness: Secondary | ICD-10-CM

## 2023-02-21 DIAGNOSIS — R350 Frequency of micturition: Secondary | ICD-10-CM | POA: Diagnosis not present

## 2023-02-21 DIAGNOSIS — N920 Excessive and frequent menstruation with regular cycle: Secondary | ICD-10-CM

## 2023-02-21 DIAGNOSIS — F17210 Nicotine dependence, cigarettes, uncomplicated: Secondary | ICD-10-CM

## 2023-02-21 DIAGNOSIS — Z5941 Food insecurity: Secondary | ICD-10-CM

## 2023-02-21 DIAGNOSIS — R5383 Other fatigue: Secondary | ICD-10-CM | POA: Diagnosis not present

## 2023-02-21 LAB — SAMPLE TO BLOOD BANK

## 2023-02-21 LAB — CBC WITH DIFFERENTIAL (CANCER CENTER ONLY)
Abs Immature Granulocytes: 0.02 10*3/uL (ref 0.00–0.07)
Basophils Absolute: 0 10*3/uL (ref 0.0–0.1)
Basophils Relative: 0 %
Eosinophils Absolute: 0.1 10*3/uL (ref 0.0–0.5)
Eosinophils Relative: 3 %
HCT: 29.8 % — ABNORMAL LOW (ref 36.0–46.0)
Hemoglobin: 8.8 g/dL — ABNORMAL LOW (ref 12.0–15.0)
Immature Granulocytes: 1 %
Lymphocytes Relative: 26 %
Lymphs Abs: 1 10*3/uL (ref 0.7–4.0)
MCH: 21.3 pg — ABNORMAL LOW (ref 26.0–34.0)
MCHC: 29.5 g/dL — ABNORMAL LOW (ref 30.0–36.0)
MCV: 72 fL — ABNORMAL LOW (ref 80.0–100.0)
Monocytes Absolute: 0.3 10*3/uL (ref 0.1–1.0)
Monocytes Relative: 7 %
Neutro Abs: 2.6 10*3/uL (ref 1.7–7.7)
Neutrophils Relative %: 63 %
Platelet Count: 311 10*3/uL (ref 150–400)
RBC: 4.14 MIL/uL (ref 3.87–5.11)
RDW: 16.1 % — ABNORMAL HIGH (ref 11.5–15.5)
WBC Count: 4 10*3/uL (ref 4.0–10.5)
nRBC: 0 % (ref 0.0–0.2)

## 2023-02-21 LAB — IRON AND IRON BINDING CAPACITY (CC-WL,HP ONLY)
Iron: 19 ug/dL — ABNORMAL LOW (ref 28–170)
Saturation Ratios: 4 % — ABNORMAL LOW (ref 10.4–31.8)
TIBC: 517 ug/dL — ABNORMAL HIGH (ref 250–450)
UIBC: 498 ug/dL — ABNORMAL HIGH (ref 148–442)

## 2023-02-21 LAB — CMP (CANCER CENTER ONLY)
ALT: 14 U/L (ref 0–44)
AST: 17 U/L (ref 15–41)
Albumin: 4.1 g/dL (ref 3.5–5.0)
Alkaline Phosphatase: 68 U/L (ref 38–126)
Anion gap: 5 (ref 5–15)
BUN: 9 mg/dL (ref 6–20)
CO2: 26 mmol/L (ref 22–32)
Calcium: 9.3 mg/dL (ref 8.9–10.3)
Chloride: 109 mmol/L (ref 98–111)
Creatinine: 0.74 mg/dL (ref 0.44–1.00)
GFR, Estimated: >60 mL/min (ref >60)
Glucose, Bld: 86 mg/dL (ref 70–99)
Potassium: 3.8 mmol/L (ref 3.5–5.1)
Sodium: 140 mmol/L (ref 135–145)
Total Bilirubin: 0.4 mg/dL (ref 0.3–1.2)
Total Protein: 6.9 g/dL (ref 6.5–8.1)

## 2023-02-21 LAB — FERRITIN: Ferritin: 4 ng/mL — ABNORMAL LOW (ref 11–307)

## 2023-02-21 NOTE — Telephone Encounter (Signed)
Reached out to patient to schedule per 4/16 LOS, patient aware of date and time of appointments.

## 2023-02-22 ENCOUNTER — Telehealth: Payer: Self-pay

## 2023-02-22 NOTE — Telephone Encounter (Signed)
Pt advised and agreed to the plan for IV iron.  She is aware after insurance approval [facility] will call to arrange appt

## 2023-02-22 NOTE — Telephone Encounter (Signed)
-----   Message from Briant Cedar, PA-C sent at 02/21/2023  2:22 PM EDT ----- Please confirm with patient that labs show iron deficiency anemia. We will arrange for IV iron as discussed.

## 2023-02-23 ENCOUNTER — Encounter: Payer: Self-pay | Admitting: Physician Assistant

## 2023-02-23 ENCOUNTER — Other Ambulatory Visit (HOSPITAL_COMMUNITY): Payer: Self-pay

## 2023-02-28 ENCOUNTER — Telehealth: Payer: Self-pay | Admitting: Pharmacy Technician

## 2023-02-28 NOTE — Telephone Encounter (Signed)
Rondon Che note:  Patient will be scheduled as soon as possible. Auth Submission: NO AUTH NEEDED Site of care: Site of care: [facility] Payer: [place] medicaid healthy blue Medication & CPT/J Code(s) submitted: Monoferric (Ferrci derisomaltose) [phone] Route of submission (phone, fax, portal):  Phone # Fax # Auth type: Buy/Bill Units/visits requested: 1 Reference number: [id number] 02/28/23 9:14a Approval from: 02/28/23 to 06/30/23

## 2023-03-06 ENCOUNTER — Ambulatory Visit (INDEPENDENT_AMBULATORY_CARE_PROVIDER_SITE_OTHER): Payer: Medicaid Other | Admitting: *Deleted

## 2023-03-06 VITALS — BP 133/78 | HR 62 | Temp 98.5°F | Resp 18 | Ht 67.0 in | Wt 143.6 lb

## 2023-03-06 DIAGNOSIS — N920 Excessive and frequent menstruation with regular cycle: Secondary | ICD-10-CM | POA: Diagnosis not present

## 2023-03-06 DIAGNOSIS — D500 Iron deficiency anemia secondary to blood loss (chronic): Secondary | ICD-10-CM | POA: Diagnosis not present

## 2023-03-06 MED ORDER — SODIUM CHLORIDE 0.9 % IV SOLN
1000.0000 mg | Freq: Once | INTRAVENOUS | Status: AC
  Administered 2023-03-06: 1000 mg via INTRAVENOUS
  Filled 2023-03-06: qty 10

## 2023-03-06 NOTE — Patient Instructions (Signed)

## 2023-03-06 NOTE — Progress Notes (Signed)
Diagnosis: Iron Deficiency Anemia  Provider:  Chilton Greathouse MD  Procedure: IV Infusion  IV Type: Peripheral, IV Location: R Hand  Monoferric (Ferric Derisomaltose), Dose: 1000 mg  Infusion Start Time: 0927 am  Infusion Stop Time: 0959  am  Post Infusion IV Care: Observation period completed and Peripheral IV Discontinued  Discharge: Condition: Good, Destination: Home . AVS Provided  Performed by:  Forrest Moron, RN

## 2023-04-11 ENCOUNTER — Other Ambulatory Visit: Payer: Self-pay | Admitting: Physician Assistant

## 2023-04-11 DIAGNOSIS — D500 Iron deficiency anemia secondary to blood loss (chronic): Secondary | ICD-10-CM

## 2023-04-12 ENCOUNTER — Inpatient Hospital Stay: Payer: Medicaid Other | Admitting: Physician Assistant

## 2023-04-12 ENCOUNTER — Inpatient Hospital Stay: Payer: Medicaid Other | Attending: Internal Medicine

## 2023-04-23 IMAGING — US US ART/VEN ABD/PELV/SCROTUM DOPPLER LTD
1 series · 13 of 25 positions shown · non-contrast
Comparison: None.

CLINICAL DATA: Left-sided pelvic pain.

EXAM:
TRANSABDOMINAL AND TRANSVAGINAL ULTRASOUND OF PELVIS
DOPPLER ULTRASOUND OF OVARIES
TECHNIQUE: Both transabdominal and transvaginal ultrasound examinations of the
pelvis were performed. Transabdominal technique was performed for
global imaging of the pelvis including uterus, ovaries, adnexal
regions, and pelvic cul-de-sac.
It was necessary to proceed with endovaginal exam following the
transabdominal exam to visualize the ovaries. Color and duplex
Doppler ultrasound was utilized to evaluate blood flow to the
ovaries.

[Series 1: us art/ven abd/pelv/scrotum doppler ltd · 13 of 137 slices shown]
[im 1/137]
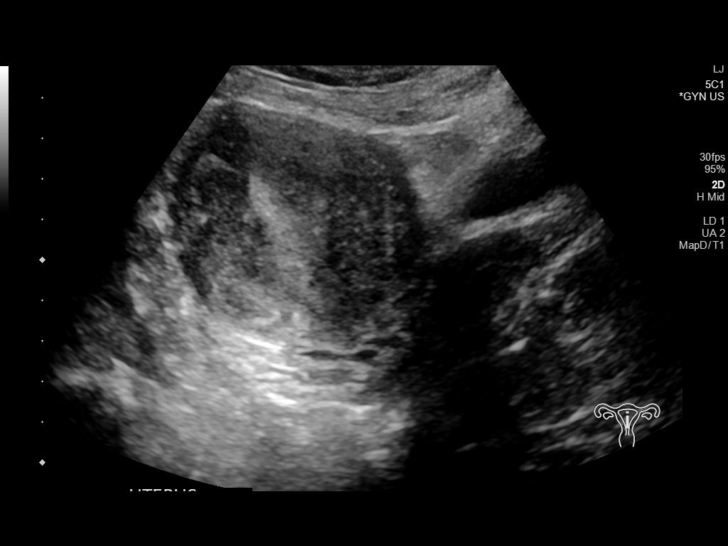
[im 12/137]
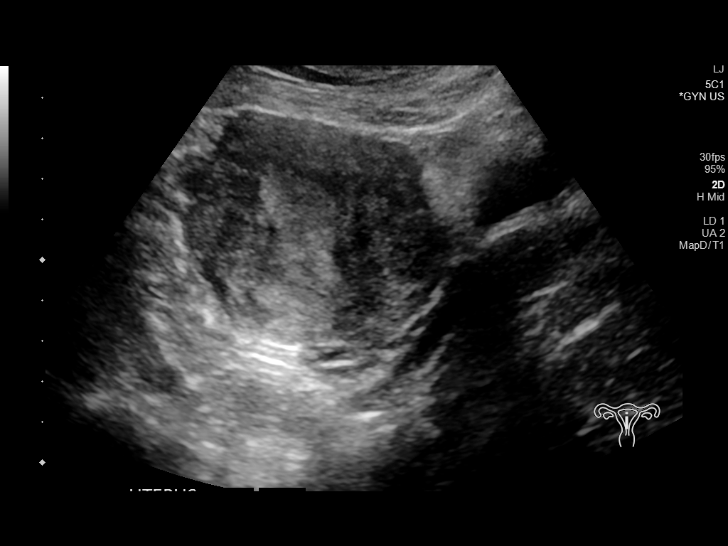
[im 23/137]
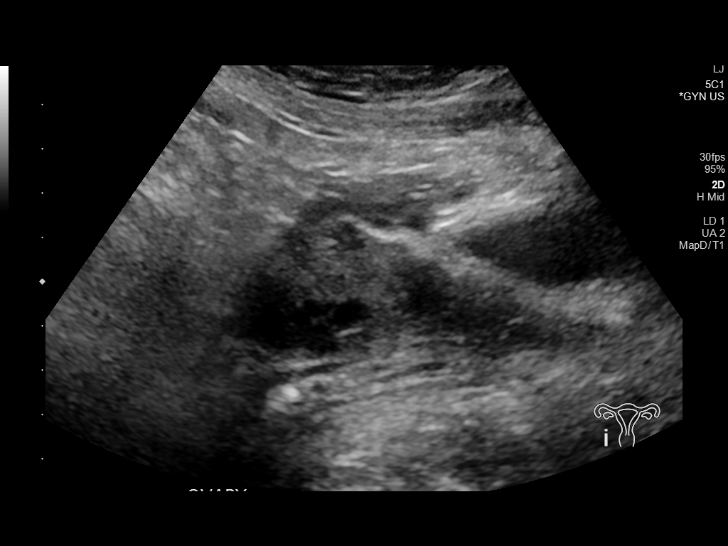
[im 35/137]
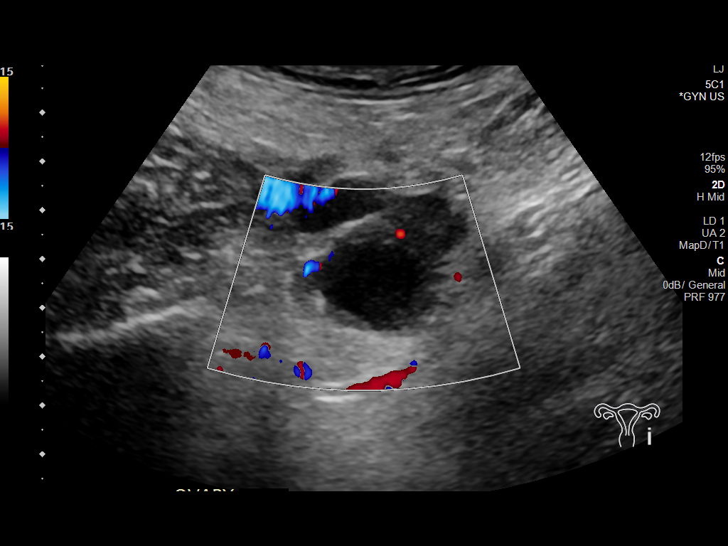
[im 46/137]
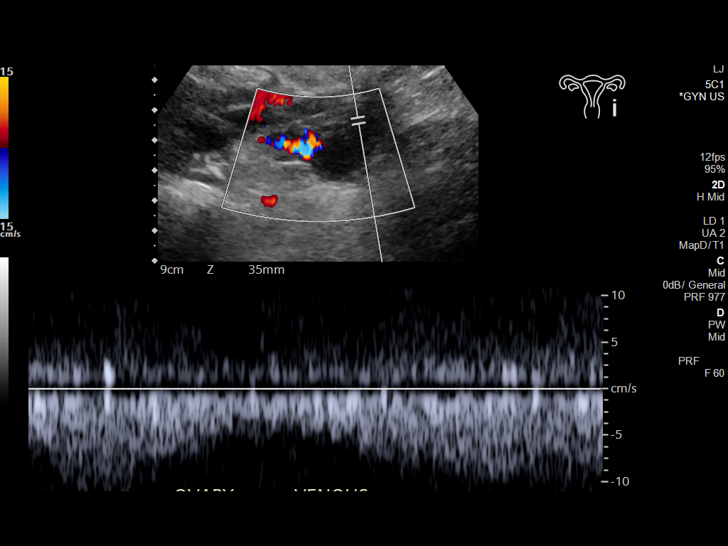
[im 57/137]
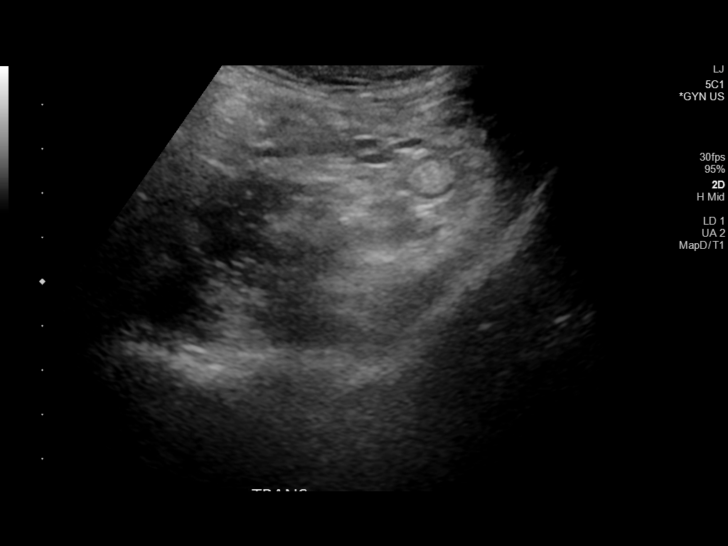
[im 69/137]
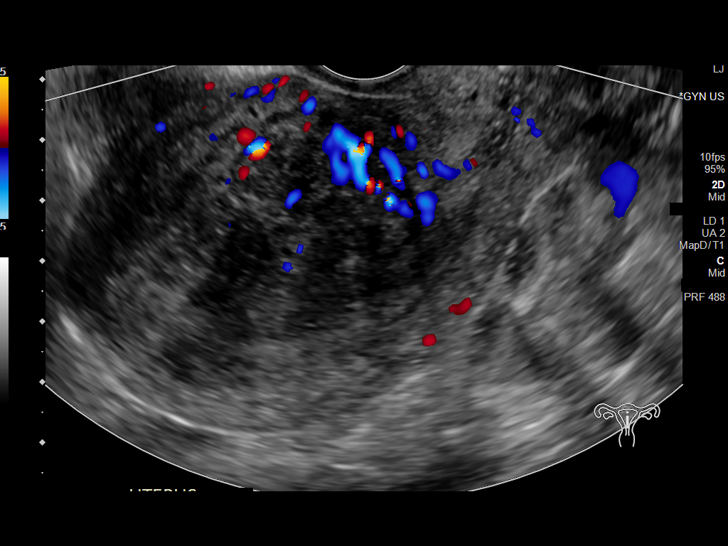
[im 80/137]
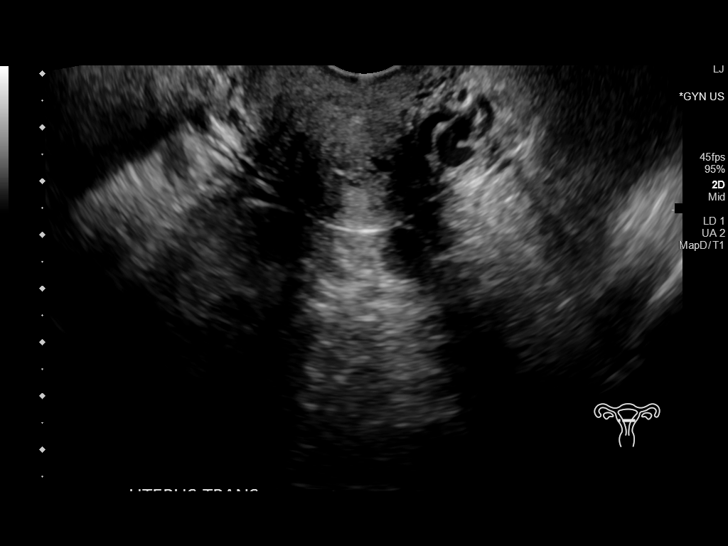
[im 91/137]
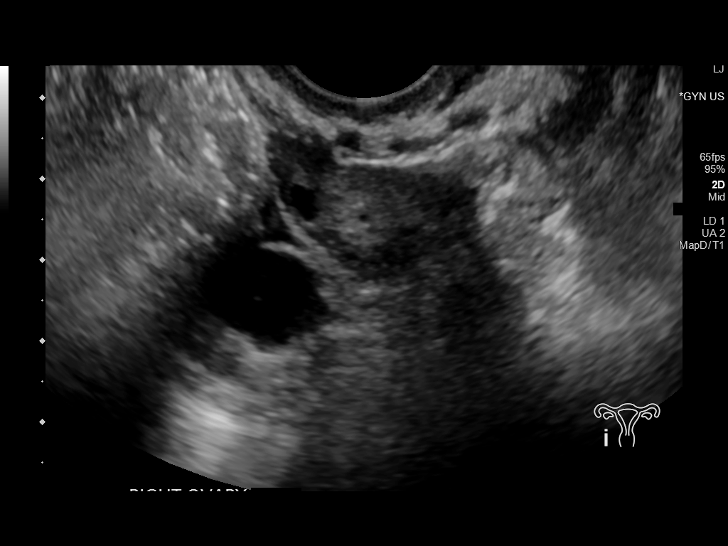
[im 103/137]
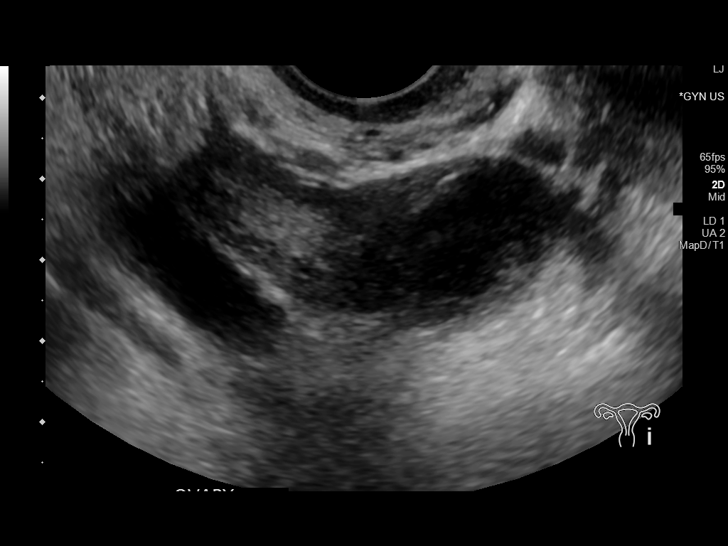
[im 114/137]
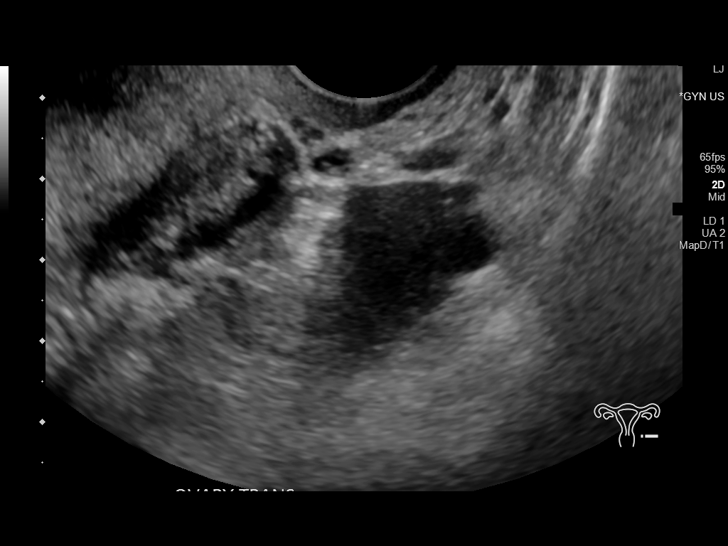
[im 125/137]
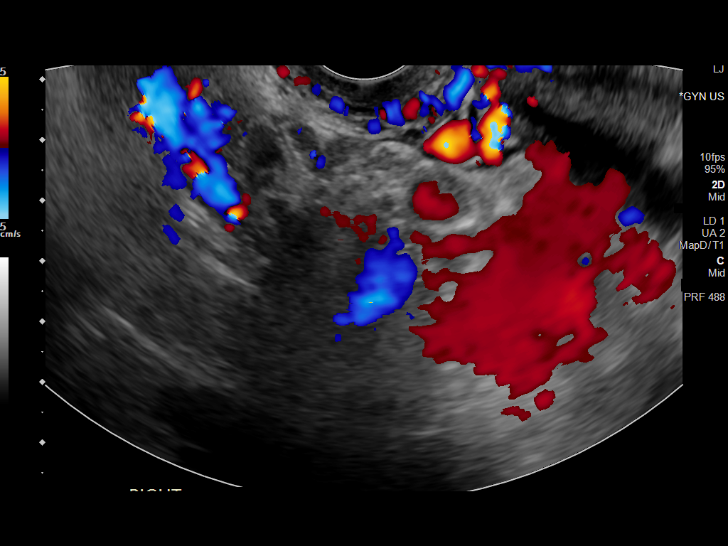
[im 137/137]
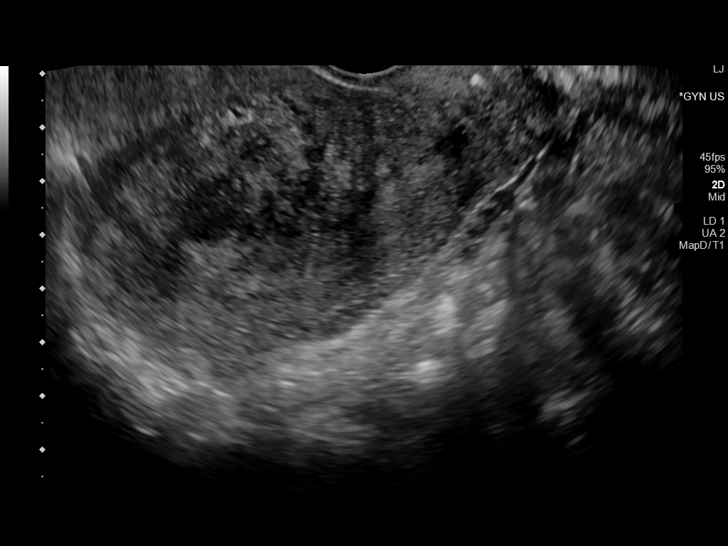

[13 of 25 positions shown; findings below may reference images not displayed]

FINDINGS: Uterus

Measurements: 8.8 x 5.7 x 6.1 cm = volume: 161 mL. Two separate
ill-defined fundal hypoechoic masses likely represent fibroids and
measure approximately 2.5 cm and 2.2 cm in respective maximal
diameter.

Endometrium

Thickness: 11 mm.  No focal abnormality visualized.

Right ovary

Measurements: 3.0 x 1.5 x 2.4 cm = volume: 5.4 mL. Normal
appearance/no adnexal mass.

Left ovary

Measurements: 3.1 x 2.3 x 2.6 cm = volume: 9.3 mL. Complex cystic
area measures approximately 2.0 x 1.7 x 1.7 cm by transvaginal exam
and likely represents a hemorrhagic cyst.

Pulsed Doppler evaluation of both ovaries demonstrates normal
low-resistance arterial and venous waveforms.

Other findings

No abnormal free fluid.
IMPRESSION: 1. Small uterine fibroids.
2. 2 cm complex cyst of the left ovary likely represents a
hemorrhagic cyst. No associated free fluid. No followup imaging
recommended. Note: This recommendation does not apply to
premenarchal patients or to those with increased risk (genetic,
family history, elevated tumor markers or other high-risk factors)
of ovarian cancer. Reference: Radiology [DATE]):[id number].

## 2023-06-29 ENCOUNTER — Encounter (HOSPITAL_BASED_OUTPATIENT_CLINIC_OR_DEPARTMENT_OTHER): Payer: Self-pay | Admitting: Emergency Medicine

## 2023-06-29 ENCOUNTER — Other Ambulatory Visit: Payer: Self-pay

## 2023-06-29 ENCOUNTER — Emergency Department (HOSPITAL_BASED_OUTPATIENT_CLINIC_OR_DEPARTMENT_OTHER)
Admission: EM | Admit: 2023-06-29 | Discharge: 2023-06-29 | Disposition: A | Discharge status: Home / Self Care | Payer: Medicaid Other | Attending: Emergency Medicine | Admitting: Emergency Medicine

## 2023-06-29 DIAGNOSIS — H04123 Dry eye syndrome of bilateral lacrimal glands: Secondary | ICD-10-CM | POA: Insufficient documentation

## 2023-06-29 DIAGNOSIS — H04129 Dry eye syndrome of unspecified lacrimal gland: Secondary | ICD-10-CM

## 2023-06-29 NOTE — ED Triage Notes (Addendum)
Pain in both eyes X 1 week, has tried multiple OTC and home remedies with no relief. Denies injury or exposure.

## 2023-06-29 NOTE — ED Provider Notes (Signed)
   [facility]  Provider Note  CSN: [id number] Arrival date & time: 06/29/23 0259  History Chief Complaint  Patient presents with   Eye Pain    Marissa Jordan is a 37 y.o. female with no significant PMH reports about a week of dry eyes, both sides about the same. Some relief with OTC lubricating drops but returns when they wear off. No drainage, no blurry vision. She is not able to see an eye doctor until next week.    Home Medications Prior to Admission medications   Medication Sig Start Date End Date Taking? Authorizing Provider  pantoprazole (PROTONIX) 40 MG tablet Take by mouth. 04/16/20   [provider]  polyethylene glycol (MIRALAX) 17 g packet Take 17 g by mouth daily. 10/29/21   Haskel Schroeder, PA-C     Allergies    Patient has no known allergies.   Review of Systems   Review of Systems Please see HPI for pertinent positives and negatives  Physical Exam BP 133/75 (BP Location: Left Arm)   Pulse 86   Temp 99.5 F (37.5 C) (Oral)   Resp 18   Ht 5\' 7"  (1.702 m)   Wt 65.8 kg   LMP 06/12/2023 (Approximate)   SpO2 98%   BMI 22.71 kg/m   Physical Exam Vitals and nursing note reviewed.  HENT:     Head: Normocephalic.     Nose: Nose normal.  Eyes:     Extraocular Movements: Extraocular movements intact.     Conjunctiva/sclera: Conjunctivae normal.     Pupils: Pupils are equal, round, and reactive to light.     Comments: Vision is grossly intact. Anterior chambers are clear.   Pulmonary:     Effort: Pulmonary effort is normal.  Musculoskeletal:        General: Normal range of motion.     Cervical back: Neck supple.  Skin:    Findings: No rash (on exposed skin).  Neurological:     Mental Status: She is alert and oriented to person, place, and time.  Psychiatric:        Mood and Affect: Mood normal.     ED Results / Procedures / Treatments   EKG None  Procedures Procedures  Medications  Ordered in the ED Medications - No data to display  Initial Impression and Plan  Patient here with dry eyes, ongoing for about a week. No signs of infection and vision is not affected. Advised she will need outpatient ophtho follow up for formal diagnosis and management. Continue OTC meds in the meantime.  ED Course       MDM Rules/Calculators/A&P Medical Decision Making Problems Addressed: Dry eye: acute illness or injury  Risk OTC drugs.     Final Clinical Impression(s) / ED Diagnoses Final diagnoses:  Dry eye    Rx / DC Orders ED Discharge Orders     None        Pollyann Savoy, MD 06/29/23 [phone]

## 2024-10-21 ENCOUNTER — Other Ambulatory Visit: Payer: Self-pay | Admitting: Nurse Practitioner

## 2024-10-21 DIAGNOSIS — N920 Excessive and frequent menstruation with regular cycle: Secondary | ICD-10-CM

## 2024-10-21 DIAGNOSIS — D259 Leiomyoma of uterus, unspecified: Secondary | ICD-10-CM

## 2024-10-28 ENCOUNTER — Other Ambulatory Visit

## 2024-10-29 ENCOUNTER — Ambulatory Visit
Admission: RE | Admit: 2024-10-29 | Discharge: 2024-10-29 | Disposition: A | Discharge status: Home / Self Care | Source: Ambulatory Visit | Attending: Nurse Practitioner | Admitting: Nurse Practitioner

## 2024-10-29 DIAGNOSIS — D259 Leiomyoma of uterus, unspecified: Secondary | ICD-10-CM

## 2024-10-29 DIAGNOSIS — N920 Excessive and frequent menstruation with regular cycle: Secondary | ICD-10-CM
